# Patient Record
Sex: Female | Born: 1970 | Race: White | Hispanic: No | Marital: Married | Smoking: Never smoker
Health system: Southern US, Community
[De-identification: ages and names within clinical notes are randomized; demographics above are authoritative.]

---

## 2003-04-17 ENCOUNTER — Ambulatory Visit (HOSPITAL_COMMUNITY): Admission: RE | Admit: 2003-04-17 | Discharge: 2003-04-17 | Payer: Self-pay | Admitting: Ophthalmology

## 2003-04-17 ENCOUNTER — Encounter: Payer: Self-pay | Admitting: Ophthalmology

## 2003-04-28 ENCOUNTER — Encounter: Admission: RE | Admit: 2003-04-28 | Discharge: 2003-04-28 | Payer: Self-pay | Admitting: Family Medicine

## 2003-04-28 ENCOUNTER — Encounter: Payer: Self-pay | Admitting: Family Medicine

## 2004-07-06 ENCOUNTER — Inpatient Hospital Stay (HOSPITAL_COMMUNITY): Admission: AD | Admit: 2004-07-06 | Discharge: 2004-07-06 | Payer: Self-pay | Admitting: Obstetrics and Gynecology

## 2004-08-12 ENCOUNTER — Inpatient Hospital Stay (HOSPITAL_COMMUNITY): Admission: RE | Admit: 2004-08-12 | Discharge: 2004-08-15 | Payer: Self-pay | Admitting: Obstetrics and Gynecology

## 2004-08-12 ENCOUNTER — Encounter (INDEPENDENT_AMBULATORY_CARE_PROVIDER_SITE_OTHER): Payer: Self-pay | Admitting: Specialist

## 2004-11-23 ENCOUNTER — Other Ambulatory Visit: Admission: RE | Admit: 2004-11-23 | Discharge: 2004-11-23 | Payer: Self-pay | Admitting: Obstetrics and Gynecology

## 2005-07-15 ENCOUNTER — Emergency Department (HOSPITAL_COMMUNITY): Admission: EM | Admit: 2005-07-15 | Discharge: 2005-07-15 | Payer: Self-pay | Admitting: Emergency Medicine

## 2005-07-18 ENCOUNTER — Emergency Department (HOSPITAL_COMMUNITY): Admission: EM | Admit: 2005-07-18 | Discharge: 2005-07-19 | Payer: Self-pay | Admitting: Emergency Medicine

## 2006-08-24 ENCOUNTER — Other Ambulatory Visit: Admission: RE | Admit: 2006-08-24 | Discharge: 2006-08-24 | Payer: Self-pay | Admitting: Family Medicine

## 2007-09-17 ENCOUNTER — Other Ambulatory Visit: Admission: RE | Admit: 2007-09-17 | Discharge: 2007-09-17 | Payer: Self-pay | Admitting: Family Medicine

## 2008-12-16 ENCOUNTER — Other Ambulatory Visit: Admission: RE | Admit: 2008-12-16 | Discharge: 2008-12-16 | Payer: Self-pay | Admitting: Family Medicine

## 2010-07-23 ENCOUNTER — Encounter: Admission: RE | Admit: 2010-07-23 | Discharge: 2010-07-23 | Payer: Self-pay | Admitting: Family Medicine

## 2011-04-22 NOTE — Op Note (Signed)
NAMECLAUDE, SWENDSEN               ACCOUNT NO.:  [id number]   MEDICAL RECORD NO.:  [id number]                   PATIENT TYPE:  INP   LOCATION:  9136                                 FACILITY:  [facility]   PHYSICIAN:  Charles A. Sydnee Cabal, MD            DATE OF BIRTH:  [birth year]   DATE OF PROCEDURE:  08/12/2004  DATE OF DISCHARGE:                                 OPERATIVE REPORT   PREOPERATIVE DIAGNOSES:  1.  Intrauterine pregnancy at 38 weeks.  2.  Previous cesarean section.  3.  Borderline oligohydramnios.   POSTOPERATIVE DIAGNOSES:  1.  Intrauterine pregnancy at 38 weeks.  2.  Previous cesarean section.  3.  Borderline oligohydramnios.   PROCEDURE:  1.  Repeat low transverse cesarean section.  2.  Cord registry blood collection.   SURGEON:  Charles A. Sydnee Cabal, MD   ASSISTANT:  Rudy Jew. Ashley Royalty, M.D.   COMPLICATIONS:  None.   ESTIMATED BLOOD LOSS:  600 mL.   FINDINGS:  Very thin lower uterine segment adherent to the bladder, vigorous  female, Apgar's 7 & 9, normal uterus, ovaries, tubes and ovaries.   SPECIMENS:  Placenta to pathology.   COUNTS:  Sponge, needle and instrument counts were correct x2.   ANESTHESIA:  Spinal.   DESCRIPTION OF PROCEDURE:  The patient was taken to the operating room and  placed in supine position. After spinal was placed, anesthesia was adequate.  A sterile prep and drape was placed. A Pfannenstiel incision was made with  the knife, carried down to fascia, fascia was incised with a knife and Mayo  scissors.  The rectus sheath was released superiorly and inferiorly sharply.  The rectus muscles were sharply dissected in the midline. The peritoneum was  entered with Metzenbaum. There was no damage to bowel, bladder or vascular  structures. The peritoneum was opened and __________ placed, lower uterine  segment vesicouterine peritoneum was incised. Blunt dissection and sharp  dissection was used to carefully take the bladder off the  lower uterine  segment. In taking the adherent portion of the bladder off the lower uterine  scar, the uterus was opened secondary to its very thin nature.  Further  careful dissection took the bladder off this area of the scar without damage  to the bladder. This allowed further dissection of the bladder flap  inferiorly. The bladder blade was replaced protecting the bladder. The  uterus was further opened with traction, clear amniotic fluid was noted,  __________ was lifted. Frontal pressure was applied by the operator's  assistant, the baby was delivered without difficulty. The cord was clamped,  infant was taken to the neonatologist in attendance. Cord registry blood was  taken for protocol with sterile syringes. The placenta was manually  extracted and sent to pathology.  The internal surface of the uterus was  wiped with moistened lap, uterus was externalized for repair. Single layer  closure with #1 chromic running locking suture was carried out.  Three  figure-of-eight sutures were used  to achieve hemostasis.  This precluded  second layer closure in that imbrication of about 1/2 the incision was  closed with the figure-of-eight sutures in an imbricating type of closure.  This just about closed the entire incision imbricating over the first  effectively imbricating a second layer.  Hemostasis was excellent. Bladder  flap hemostasis was excellent. The uterus was reinternalized, pericolic  gutters were irrigated and subfascial hemostasis was verified.  The fascia  was then closed with #1 Vicryl running nonlocked sutures, subcutaneous  hemostasis was verified after irrigation. Sterile skin clips were applied,  sterile dressing was applied. The patient was taken to recovery with  physician in attendance having tolerated the procedure well.                                               Charles A. Sydnee Cabal, MD    CAD/MEDQ  D:  08/12/2004  T:  08/12/2004  Job:  [id number]

## 2011-04-22 NOTE — Discharge Summary (Signed)
Joan Ross, [place]ITZ   ACCOUNT NO.:  [id number]   MEDICAL RECORD NO.:  [id number]          PATIENT TYPE:  INP   LOCATION:  9136                          [facility]ACILITY:  [facility]   PHYSICIAN:  Charles A. Delcambre, MDDATE OF BIRTH:  1971/07/16   DATE OF ADMISSION:  08/12/2004  DATE OF DISCHARGE:  08/15/2004                                 DISCHARGE SUMMARY   PRIMARY DISCHARGE DIAGNOSES:  1.  Intrauterine pregnancy at 38 weeks.  2.  Previous cesarean section.  3.  Borderline oligohydramnios.   PROCEDURES:  1.  Repeat low transverse cesarean section.  2.  Cord registry blood collection.   DISPOSITION:  The patient discharged home to follow up in the office in 24-  48 hours to discontinue staples.  She was given instructions on discharge by  Dr. Lily Peer.  A prescription for Percocet 5/325 q.4-6h. p.r.n., Chromagen  Forte one p.o. daily.  Convalesce at home.  Notify of temperature greater  than 100 degrees, increased pain or bleeding, or incisional drainage.  No  driving for 2 weeks, no heavy lifting greater than 25 pounds for 4 weeks.  Shower okay, no bath or incisional submerging for 2 weeks.   HOSPITAL FINDINGS:  Postoperative laboratory:  Hematocrit 27.4, hemoglobin  9.3.  Baby a vigorous female, Apgars 7 and 9, 3285 g.   HISTORY AND PHYSICAL:  As dictated on the chart.   HOSPITAL COURSE:  The patient was admitted and underwent surgery as noted  above.  Postoperatively, the patient had good pain control with Duramorph.  She had Foley catheter discontinued postoperative day #1 and had no  difficulty voiding.  She had flatus return postoperative day #1.  She had  general diet given and tolerated this well.  Pain was controlled with p.o.  medications.  She had a bowel movement on postoperative day #2, continued to  do well on a general diet.  Pain was controlled.  She ambulated without  difficulty.  Postoperative day #3 she remained without complication and was  discharged  home with follow-up as noted above.      CAD/MEDQ  D:  08/30/2004  T:  08/31/2004  Job:  [id number]

## 2011-04-22 NOTE — H&P (Signed)
NAME:  Joan Ross, Joan Ross               ACCOUNT NO.:  [id number]   MEDICAL RECORD NO.:  [id number]                   PATIENT TYPE:   LOCATION:                                       FACILITY:   PHYSICIAN:  Charles A. Sydnee Cabal, MD            DATE OF BIRTH:  May 04, 1971   DATE OF ADMISSION:  DATE OF DISCHARGE:                                HISTORY & PHYSICAL   HISTORY OF PRESENT ILLNESS:  A 40 year old para 1-0-0-1 with previous  cesarean section and known scar will be at 38 weeks 0 days, [facility] August 13, 2004.  On the date of admission she is to be admitted to undergo a repeat  cesarean section.  The surgery had been scheduled for 39-40 weeks but will  be moved up at this time secondary to an AFI done two days prior to  admission, showing AFI only 7 cm.  With borderline oligohydramnios noted, we  have moved the surgery up.  The patient gives an informed consent.  She  understands the risks of the possibility of respiratory distress with  prematurity type of syndrome but the indications being borderline  oligohydramnios with resultant risks of prolonging pregnancy at this point  of danger to the baby in this regard.  She gives an informed consent for a  repeat cesarean section.  She accepts the risks of infection, bleeding,  bowel and bladder damage, uterine damage, blood products risk including  hepatitis and HIV exposure.  All questions were answered and we will proceed  as outlined.  She gives an informed consent.   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   SURGICAL HISTORY:  C-section.  She desires a repeat.   MEDICATIONS:  Prenatal vitamins.   ALLERGIES:  SULFA.   SOCIAL HISTORY:  She denies tobacco, ethanol, or drug use or __________  exposure in the past.  She is married and is in a monogamous relationship  with her husband, who is a PhD professor in Building surveyor at [employer].   FAMILY HISTORY:  She has a sister with schizophrenia.  Maternal  grandmother  and maternal great-grandmother with abdominal aortic aneurysms.  Mother with  some type of cancer felt secondary to a contraceptive implant? or ovary  cancer.  Mother is alive and well and living without difficulty at this time  after a hysterectomy and oophorectomy.   REVIEW OF SYSTEMS:  No nausea and vomiting currently.  She denies fever,  chills, rashes, lesions, headache, dizziness, chest pain, shortness of  breath, wheezing, diarrhea, constipation, bleeding, melena, hematochezia,  urgency, frequency, dysuria, incontinence, hematuria, galactorrhea, or  emotional changes.   PHYSICAL EXAMINATION:  VITAL SIGNS:  Blood pressure 120/78, weight 179  pounds, height 5 feet 2 inches, respirations 18, pulse 90.  HEENT:  Grossly within normal limits.  NECK:  Supple without thyromegaly or adenopathy.  LUNGS:  Clear bilaterally.  BACK:  No CVAT. Vertebral column nontender to palpation.  BREASTS:  No masses, tenderness, or discharge.  SKIN:  Nipple changes bilaterally.  HEART:  Regular rate and rhythm.  2/6 systolic ejection murmur left sternal  border.  ABDOMEN:  Gravid fundal height 37-38 cm.  Fetal heart rate on ultrasound  today 138.  AFI as noted 7 cm.  PELVIC:  Normal external female genitalia.  The remainder of the examination  deferred.  EXTREMITIES:  Mild to moderate edema bilaterally.   ASSESSMENT:  1.  Intrauterine pregnancy to be 38 weeks 0 days on the date of admission.  2.  Borderline oligohydramnios.  3.  Previous cesarean section desiring repeat cesarean section.   PLAN:  N.p.o. past midnight.  Preoperative CBC and type and screen.  We will  plan a repeat cesarean section.  Counseling as noted above.  An informed  consent is given.  We will proceed as outlined.  Blood type O positive,  antibody screen negative for all prenatal labs. VDRL nonreactive, Rubella  immune.  Hepatitis B surface antigen negative.  HIV negative.  Cystic  fibrosis negative.  Hepatitis C  negative.  TSH normal.  GC and Chlamydia  declined.  Pap negative.  Triple and quad screen normal.  One hour Glucola  105.  Hemoglobin 11.5.  Group B strep negative.                                               Charles A. Sydnee Cabal, MD    CAD/MEDQ  D:  08/10/2004  T:  08/10/2004  Job:  [id number]

## 2011-12-13 ENCOUNTER — Other Ambulatory Visit: Payer: Self-pay | Admitting: Obstetrics and Gynecology

## 2011-12-13 ENCOUNTER — Other Ambulatory Visit (HOSPITAL_COMMUNITY)
Admission: RE | Admit: 2011-12-13 | Discharge: 2011-12-13 | Disposition: A | Discharge status: Home / Self Care | Payer: BC Managed Care – PPO | Source: Ambulatory Visit | Attending: Obstetrics and Gynecology | Admitting: Obstetrics and Gynecology

## 2011-12-13 DIAGNOSIS — Z01419 Encounter for gynecological examination (general) (routine) without abnormal findings: Secondary | ICD-10-CM | POA: Insufficient documentation

## 2012-07-02 ENCOUNTER — Other Ambulatory Visit: Payer: Self-pay | Admitting: Family Medicine

## 2012-07-02 DIAGNOSIS — Z1231 Encounter for screening mammogram for malignant neoplasm of breast: Secondary | ICD-10-CM

## 2012-07-11 ENCOUNTER — Ambulatory Visit
Admission: RE | Admit: 2012-07-11 | Discharge: 2012-07-11 | Disposition: A | Discharge status: Home / Self Care | Payer: BC Managed Care – PPO | Source: Ambulatory Visit | Attending: Family Medicine | Admitting: Family Medicine

## 2012-07-11 DIAGNOSIS — Z1231 Encounter for screening mammogram for malignant neoplasm of breast: Secondary | ICD-10-CM

## 2013-07-30 ENCOUNTER — Other Ambulatory Visit: Payer: Self-pay | Admitting: Family Medicine

## 2013-07-30 ENCOUNTER — Other Ambulatory Visit: Payer: Self-pay

## 2013-07-31 ENCOUNTER — Other Ambulatory Visit: Payer: Self-pay | Admitting: Family Medicine

## 2013-07-31 DIAGNOSIS — N649 Disorder of breast, unspecified: Secondary | ICD-10-CM

## 2013-08-15 ENCOUNTER — Other Ambulatory Visit: Payer: Self-pay | Admitting: Family Medicine

## 2013-08-15 ENCOUNTER — Ambulatory Visit
Admission: RE | Admit: 2013-08-15 | Discharge: 2013-08-15 | Disposition: A | Discharge status: Home / Self Care | Payer: BC Managed Care – PPO | Source: Ambulatory Visit | Attending: Family Medicine | Admitting: Family Medicine

## 2013-08-15 DIAGNOSIS — N649 Disorder of breast, unspecified: Secondary | ICD-10-CM

## 2014-05-23 ENCOUNTER — Other Ambulatory Visit: Payer: Self-pay | Admitting: Family Medicine

## 2014-05-23 ENCOUNTER — Ambulatory Visit
Admission: RE | Admit: 2014-05-23 | Discharge: 2014-05-23 | Disposition: A | Discharge status: Home / Self Care | Payer: BC Managed Care – PPO | Source: Ambulatory Visit | Attending: Family Medicine | Admitting: Family Medicine

## 2014-05-23 DIAGNOSIS — M79671 Pain in right foot: Secondary | ICD-10-CM

## 2015-03-19 ENCOUNTER — Other Ambulatory Visit: Payer: Self-pay | Admitting: Family Medicine

## 2015-03-19 ENCOUNTER — Other Ambulatory Visit (HOSPITAL_COMMUNITY)
Admission: RE | Admit: 2015-03-19 | Discharge: 2015-03-19 | Disposition: A | Discharge status: Home / Self Care | Payer: BC Managed Care – PPO | Source: Ambulatory Visit | Attending: Family Medicine | Admitting: Family Medicine

## 2015-03-19 DIAGNOSIS — Z01419 Encounter for gynecological examination (general) (routine) without abnormal findings: Secondary | ICD-10-CM | POA: Diagnosis present

## 2015-03-20 LAB — CYTOLOGY - PAP

## 2015-05-13 ENCOUNTER — Encounter: Payer: Self-pay | Admitting: Sports Medicine

## 2015-05-13 ENCOUNTER — Ambulatory Visit (INDEPENDENT_AMBULATORY_CARE_PROVIDER_SITE_OTHER): Payer: BC Managed Care – PPO | Admitting: Sports Medicine

## 2015-05-13 VITALS — BP 114/76 | HR 66 | Ht 62.0 in | Wt 143.0 lb

## 2015-05-13 DIAGNOSIS — M76899 Other specified enthesopathies of unspecified lower limb, excluding foot: Secondary | ICD-10-CM | POA: Insufficient documentation

## 2015-05-13 DIAGNOSIS — M76891 Other specified enthesopathies of right lower limb, excluding foot: Secondary | ICD-10-CM | POA: Diagnosis not present

## 2015-05-13 MED ORDER — NITROGLYCERIN 0.2 MG/HR TD PT24: MEDICATED_PATCH | TRANSDERMAL | Status: DC

## 2015-05-13 NOTE — Progress Notes (Signed)
  Joan ShanksMatina Ross - 44 y.o. female MRN [id number]  Date of birth: [birth year]  SUBJECTIVE: Including CC, HPI, ROS Chief Complaint  Patient presents with  . New Evaluation    Right anterior hip pain   HPI Comments: Insidious onset of right anterior hip pain following running. Reports nonradiating pain over the right anterior lateral hip. Pain is most focally over the belly of the tensor fascia lata. Pain over the greater trochanter as well. Has tried taking occasional ibuprofen and using ice without significant improvement. Having a difficult time with running and has had to stop after 2-3 miles due to his worsening pain. No significant nighttime awakenings. Denies any numbness, tingling recent fevers, chills weight loss. She does report briefly losing her balance and having a essentially small right ankle inversion injury during the run prior to the onset of this pain.   Review of Systems  All other systems reviewed and are negative.   OBJECTIVE: BP 114/76 mmHg  Pulse 66  Ht 5\' 2"  (1.575 m)  Wt 143 lb (64.864 kg)  BMI 26.15 kg/m2  Physical Exam  PHYSICAL EXAM: GENERAL: Adult Caucasian female. No acute distress PSYCH: Alert and appropriately interactive. SKIN: No open skin lesions or abnormal skin markings on areas inspected as below VASCULAR: DP & PT pulses 2+/4. No pre-tibial edema NEURO: Lower extremity strength is 5+/5 in all myotomes; sensation is intact to light touch in all dermatomes. RIGHT HIP: Overall well aligned no significant deformity. She has tenderness palpation over the tensor fascia lata as well as the greater trochanter. Strength is 5/5 in hip abduction, hip flexion and extension. Knee range of motion is normal. Internal/external rotation of the hip is normal. Negative FABER and FADIR. Negative logroll   DATA REVIEWED & OBTAINED:  LIMITED MSK ULTRASOUND OF RIGHT hip: Findings:   Disorganized tissue texture at the midportion of the lateral tensor fascia lata  without significant neovascularity. There is a small bursal layer over the greater trochanter. 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 No specialty comments available. Social History   Occupational History  . Biology Professor [employer]   Social History Main Topics  . Smoking status: Never Smoker   . Smokeless tobacco: Not on file  . Alcohol Use: Not on file  . Drug Use: Not on file  . Sexual Activity: Not on file     Problem  Hip Abductor Tendinitis      ASSESSMENT & PLAN: See Patient instructions for additional information   ICD-9-CM ICD-10-CM   1. Hip abductor tendinitis, right 726.5 M76.891 nitroGLYCERIN (NITRODUR - DOSED IN MG/24 HR) 0.2 mg/hr patch   Ultrasound evidence of partial tearing tensor fascia lata with reactive bursa over the greater trochanter     Hip abductor tendinitis Tensor fascia lata eccentric exercises Activity modification, limiting to pain in more than 2-3 out of 10. Nitroglycerin protocol    FOLLOW UP:  Return in about 10 weeks (around 07/22/2015) for repeat ultrasound.

## 2015-05-13 NOTE — Assessment & Plan Note (Signed)
Tensor fascia lata eccentric exercises Activity modification, limiting to pain in more than 2-3 out of 10. Nitroglycerin protocol

## 2015-07-22 ENCOUNTER — Ambulatory Visit: Payer: BC Managed Care – PPO | Admitting: Sports Medicine

## 2015-07-29 ENCOUNTER — Ambulatory Visit (INDEPENDENT_AMBULATORY_CARE_PROVIDER_SITE_OTHER): Payer: BC Managed Care – PPO | Admitting: Sports Medicine

## 2015-07-29 ENCOUNTER — Encounter: Payer: Self-pay | Admitting: Sports Medicine

## 2015-07-29 VITALS — BP 110/77 | Ht 62.0 in | Wt 143.0 lb

## 2015-07-29 DIAGNOSIS — M76891 Other specified enthesopathies of right lower limb, excluding foot: Secondary | ICD-10-CM

## 2015-07-29 NOTE — Progress Notes (Signed)
   Subjective:    Patient ID: Joan Ross, female    DOB: 09/08/1971, 44 y.o.   MRN: [id number]  HPI   Patient comes in today for follow-up on a right tensor fascia lata muscle tear. She is about 3 months out from her last office visit. Overall she is improving. She has returned to running but still experiencing some discomfort along the lateral hip. She gets some noticeable swelling as well. Initially her pain was severe enough that it was awakening her at night but that has improved. She denies groin pain. Previous ultrasound done by Dr. Berline Chough confirmed a partial tensor fascia lata tear. She was given a nitroglycerin protocol and instructed in eccentric exercises. She did not tolerate the nitroglycerin due to a headache. She has been compliant with her exercises.    Review of Systems     Objective:   Physical Exam Well-developed, well-nourished. No acute distress. Awake alert and oriented 3. Vital signs reviewed  Right hip: Smooth painless hip range of motion with a negative log roll. She still has tenderness to palpation over the tensor fascia lata. No palpable defect. No significant soft tissue swelling. Good hip abductor strength. Neurovascularly intact distally. Walking without a significant limp.  MSK ultrasound of the lateral right hip was performed and compared to previous scans. Overall the hypoechoic area seen in the tensor fascia lata on her previous scan has decreased. However there does appear to be some separation of the fibers with active abduction. Findings are consistent with a healing tensor fascia lata tear       Assessment & Plan:  Improving lateral right hip pain secondary to tensor fascia lata tear  Patient will continue with her current treatment including eccentric exercises and increasing activity as tolerated. We discussed the 10% rule and how it relates to running. Follow-up in 6 weeks for repeat ultrasound. Call with questions or concerns in the  interim.

## 2015-09-09 ENCOUNTER — Ambulatory Visit: Payer: BC Managed Care – PPO | Admitting: Sports Medicine

## 2016-01-29 IMAGING — CR DG FOOT COMPLETE 3+V*R*
3 series · 3 of 3 positions shown · non-contrast
Comparison: None.

CLINICAL DATA: Trauma. Stepped on something. Wound on the bottom of
the foot.

EXAM:
RIGHT FOOT COMPLETE - 3+ VIEW

[t foot ap right]
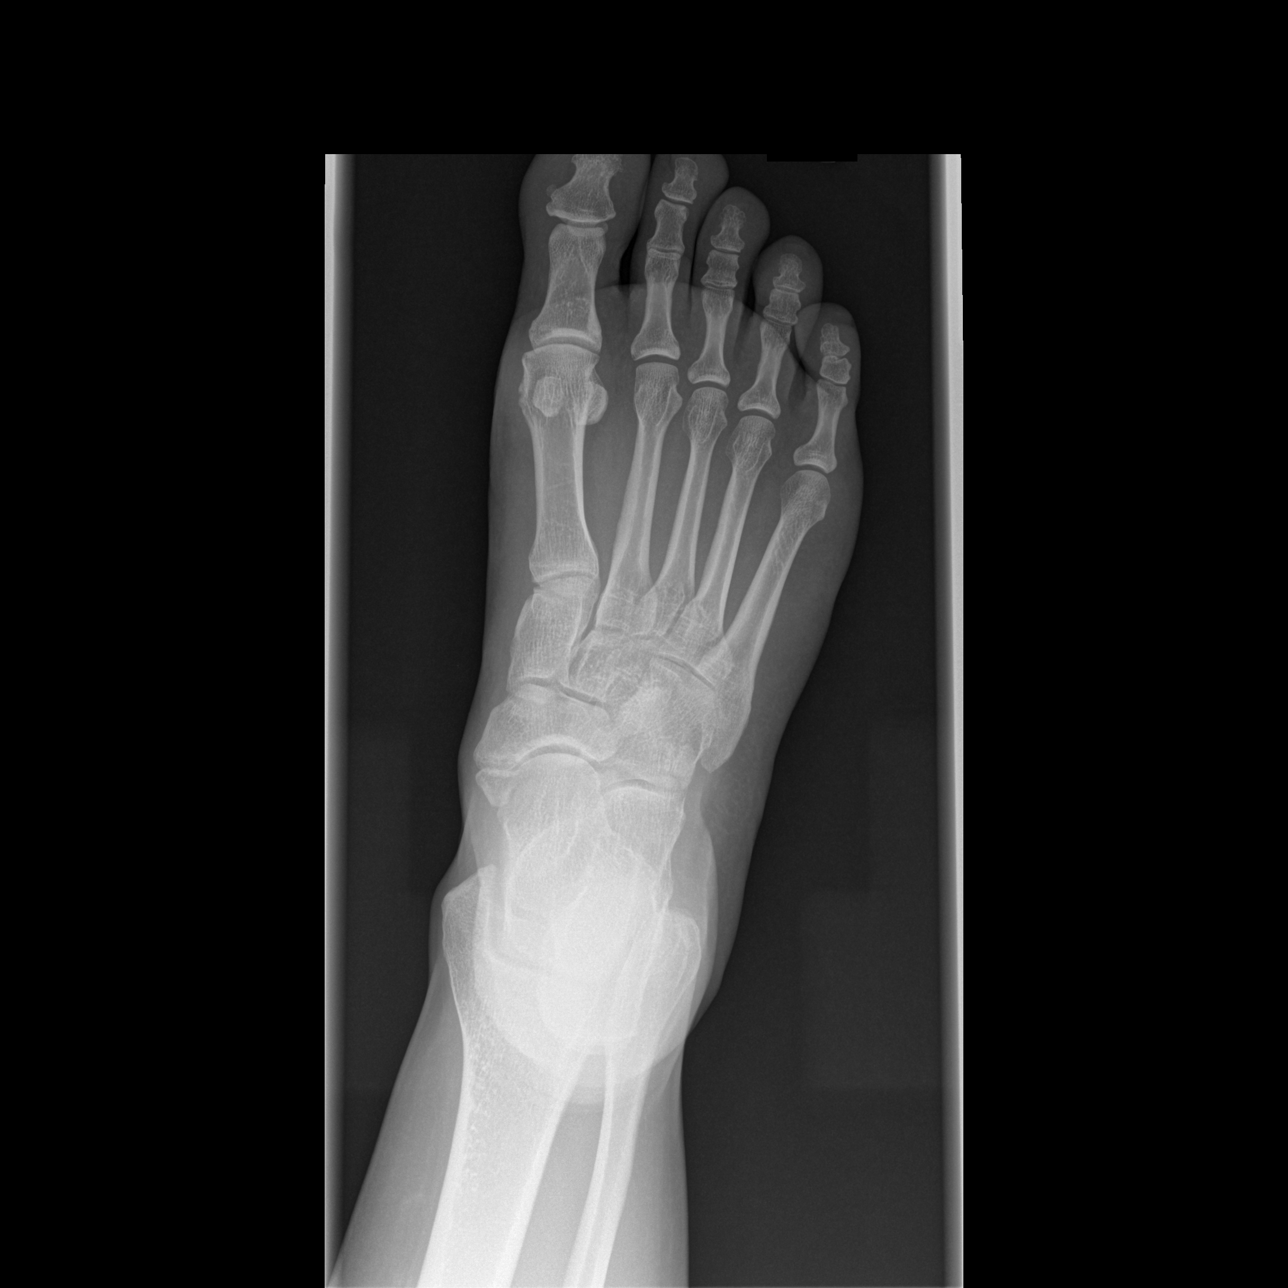

[t foot oblique right]
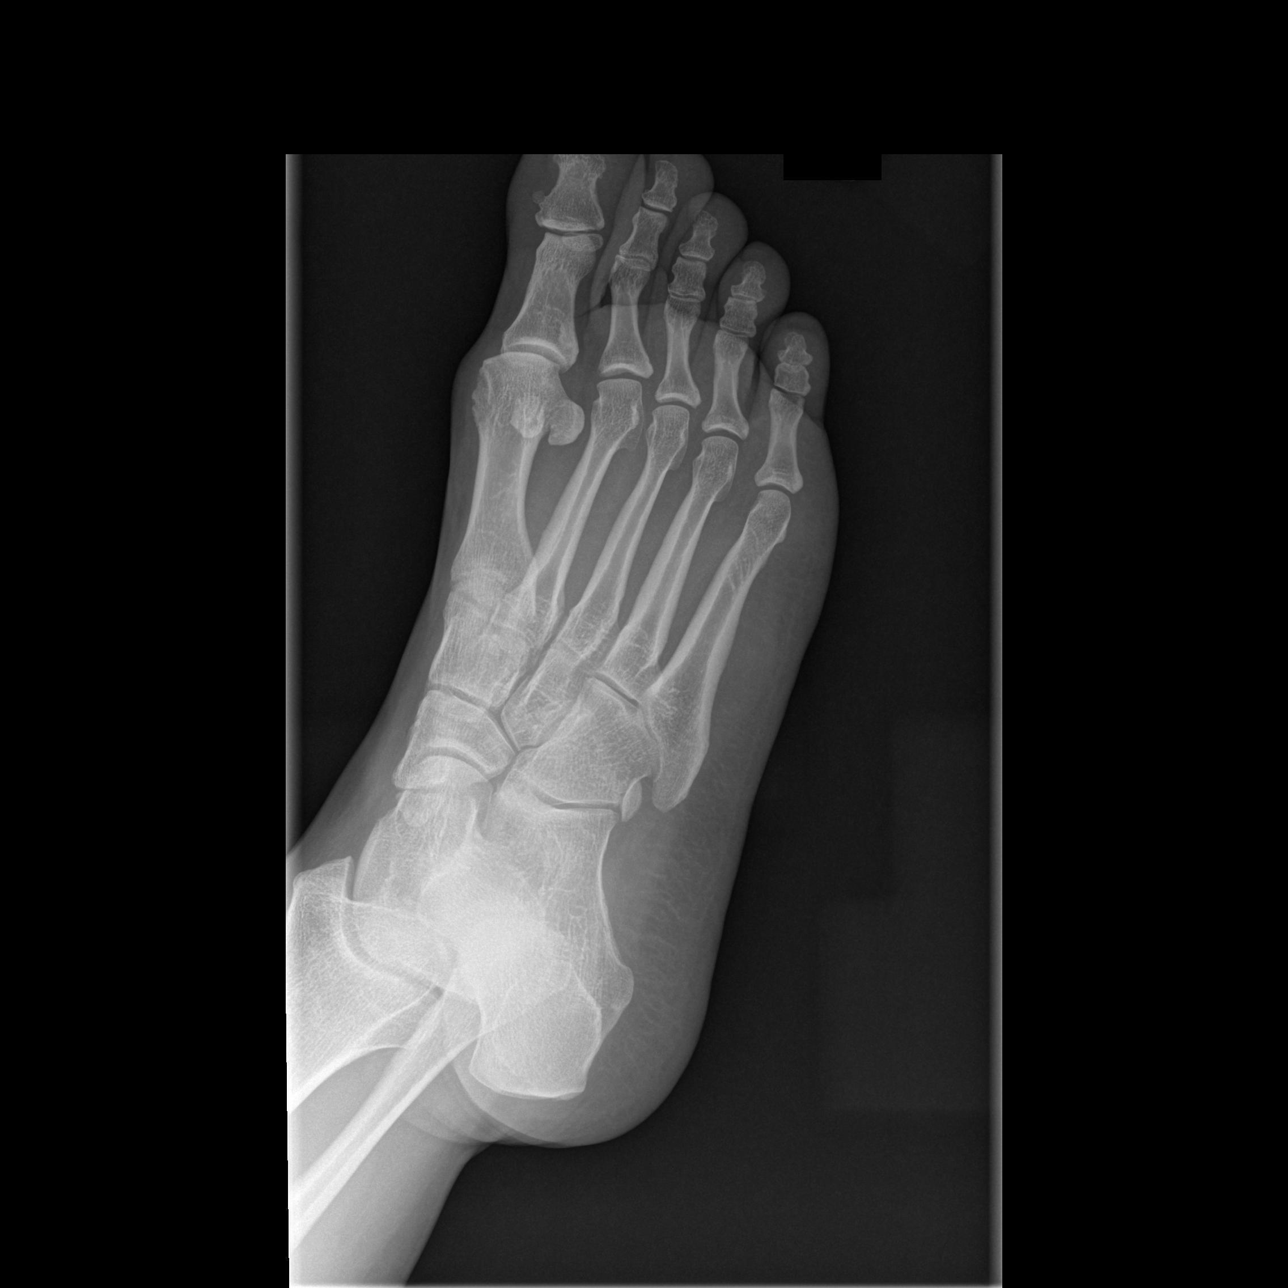

[t foot lat right]
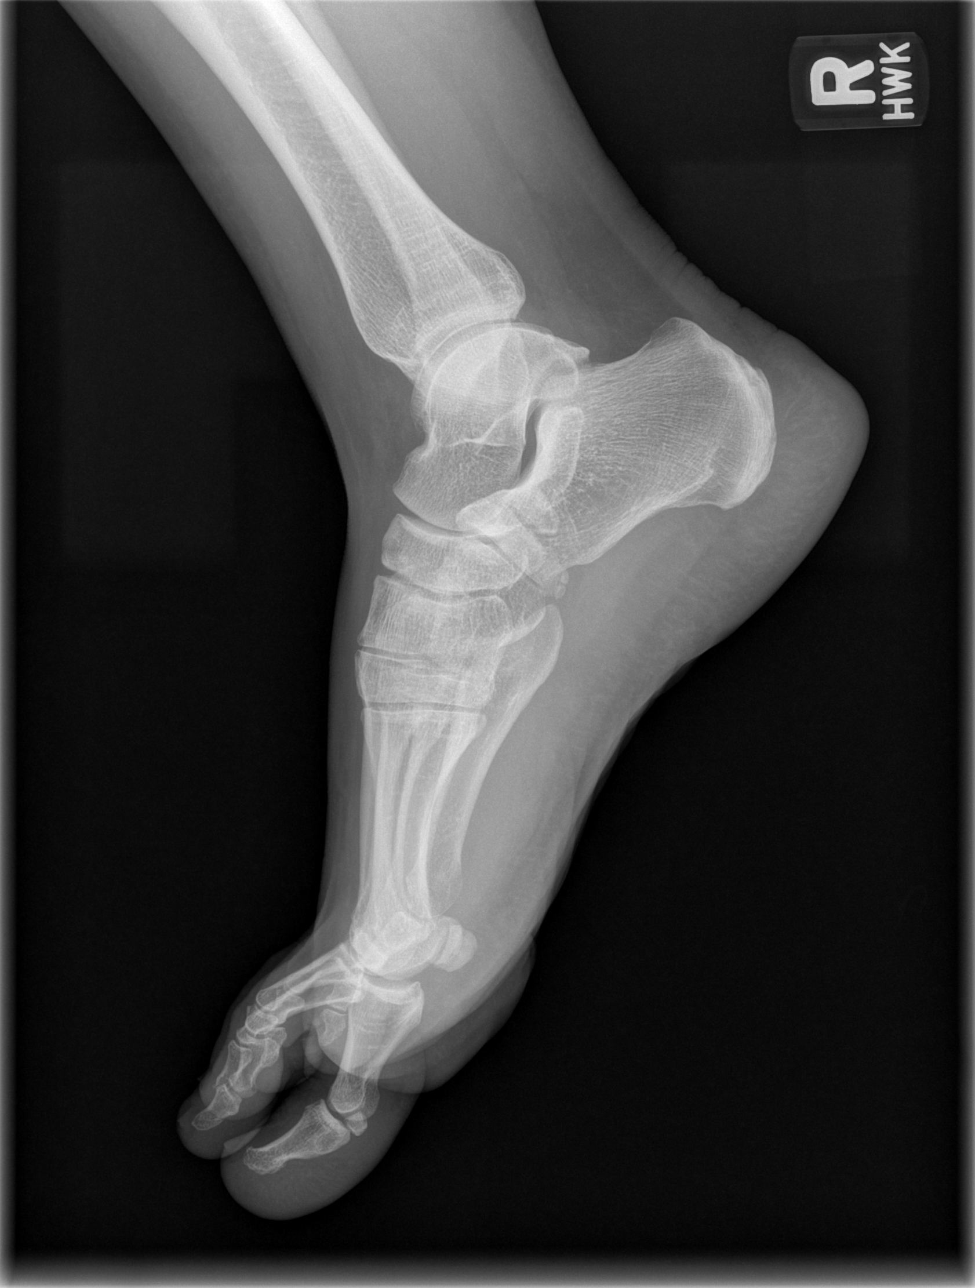

[3 of 3 positions shown; findings below may reference images not displayed]

FINDINGS: No fracture. No dislocation. Mild osteoarthritic changes at the
first metatarsophalangeal joint.

There is no radiopaque foreign body.
IMPRESSION: No fracture or dislocation.  No radiopaque foreign body.

## 2017-06-23 ENCOUNTER — Ambulatory Visit (INDEPENDENT_AMBULATORY_CARE_PROVIDER_SITE_OTHER): Payer: BC Managed Care – PPO | Admitting: Internal Medicine

## 2017-06-23 ENCOUNTER — Ambulatory Visit (INDEPENDENT_AMBULATORY_CARE_PROVIDER_SITE_OTHER)
Admission: RE | Admit: 2017-06-23 | Discharge: 2017-06-23 | Disposition: A | Discharge status: Home / Self Care | Payer: BC Managed Care – PPO | Source: Ambulatory Visit | Attending: Internal Medicine | Admitting: Internal Medicine

## 2017-06-23 ENCOUNTER — Encounter: Payer: Self-pay | Admitting: Internal Medicine

## 2017-06-23 VITALS — BP 110/60 | HR 62 | Ht 62.0 in | Wt 145.0 lb

## 2017-06-23 DIAGNOSIS — Z77090 Contact with and (suspected) exposure to asbestos: Secondary | ICD-10-CM

## 2017-06-23 NOTE — Progress Notes (Signed)
Subjective:     Patient ID: Joan ShanksMatina Ross, female   DOB: March 22, 1971,     MRN: [id number]  HPI  46 yo [place]/American/ [place] citizen minimal casual smoking hx referred to pulmonary clinic 06/23/2017 by Dr  Merri Brunetteandace Smith p reported asbestos exposure   2003 - Nov 2017    History of Present Illness  06/23/2017 1st [facility] office visit/ Amaani Guilbault   Chief Complaint  Patient presents with  . Pulmonary Consult    Referred by Dr. Merri Brunetteandace Smith. Pt states that she was exposed to asbestos- worked in a building with asbestos for 15 yrs.   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 No unusual exp hx or h/o childhood pna/ asthma or knowledge of premature birth.  Sleeping ok without nocturnal  or early am exacerbation  of respiratory  c/o's or need for noct saba. Also denies any obvious fluctuation of symptoms with weather or environmental changes or other aggravating or alleviating factors except as outlined above   Current Medications, Allergies, Complete Past Medical History, Past Surgical History, Family History, and Social History were reviewed in [facility] electronic medical record.  ROS  The following are not active complaints unless bolded sore throat, dysphagia, dental problems, itching, sneezing,  nasal congestion or excess/ purulent secretions, ear ache,   fever, chills, sweats, unintended wt loss, classically pleuritic or exertional cp, hemoptysis,  orthopnea pnd or leg swelling, presyncope, palpitations, abdominal pain, anorexia, nausea, vomiting, diarrhea  or change in bowel or bladder habits, change in stools or  urine, dysuria,hematuria,  rash, arthralgias, visual complaints, headache, numbness, weakness or ataxia or problems with walking or coordination,  change in mood/affect or memory.        Review of Systems     Objective:   Physical Exam  amb wf nad  Wt Readings from Last 3 Encounters:  06/23/17 145 lb (65.8 kg)  07/29/15 143 lb (64.9 kg)  05/13/15 143 lb (64.9 kg)    Vital signs reviewed  - Note on arrival 02 sats  100% on RA    HEENT: nl dentition, turbinates bilaterally, and oropharynx. Nl external ear canals without cough reflex   NECK :  without JVD/Nodes/TM/ nl carotid upstrokes bilaterally   LUNGS: no acc muscle use,  Nl contour chest which is clear to A and P bilaterally without cough on insp or exp maneuvers   CV:  RRR  no s3 or murmur or increase in P2, and no edema   ABD:  soft and nontender with nl inspiratory excursion in the supine position. No bruits or organomegaly appreciated, bowel sounds nl  MS:  Nl gait/ ext warm without deformities, calf tenderness, cyanosis or clubbing No obvious joint restrictions   SKIN: warm and dry without lesions    NEURO:  alert, approp, nl sensorium with  no motor or cerebellar deficits apparent.    CXR PA and Lateral:   06/23/2017 :    I personally reviewed images and agree with radiology impression as follows:   Lungs clear. No pleural plaques or diaphragmatic calcification. Cardiac silhouette within normal limits. No evident adenopathy.  Assessment:

## 2017-06-23 NOTE — Patient Instructions (Signed)
Please remember to go to the  x-ray department downstairs in the basement  for your tests - we will call you with the results when they are available.    Please schedule a follow up visit in 12  months but call sooner if needed    Call if new cough or short of breath or pain with deep breathing

## 2017-06-23 NOTE — Assessment & Plan Note (Signed)
Reported exp 08/2016 - 11/2016 with nl cxr 06/23/2017   Reviewed the natural hx of asbestos lung dz including pleural plaques and ILD with risk of lung ca quite high in smokers  Which would not rise to threshold of justifying serial ct though perhaps she might consider Low dose screening if really worried about it (based on her co-workers) and paying out of pocket (as would not likely be reimbursed by insurance).   Discussed in detail all the  indications, usual  risks and alternatives  relative to the benefits with patient who agrees to proceed with conservative f/u as outlined    Total time devoted to counseling  > 50 % of initial 40 min office visit:  review case with pt/ discussion of options/alternatives/ personally creating written customized instructions  in presence of pt  then going over those specific  Instructions directly with the pt including how to use all of the meds but in particular covering each new medication in detail and the difference between the maintenance= "automatic" meds and the prns using an action plan format for the latter (If this problem/symptom => do that organization reading Left to right).  Please see AVS from this visit for a full list of these instructions which I personally wrote for this pt and  are unique to this visit.

## 2017-06-23 NOTE — Progress Notes (Signed)
LMOM informing her that her cxr was normal

## 2018-04-10 ENCOUNTER — Other Ambulatory Visit: Payer: Self-pay | Admitting: Family Medicine

## 2018-04-10 ENCOUNTER — Other Ambulatory Visit (HOSPITAL_COMMUNITY)
Admission: RE | Admit: 2018-04-10 | Discharge: 2018-04-10 | Disposition: A | Discharge status: Home / Self Care | Payer: BC Managed Care – PPO | Source: Ambulatory Visit | Attending: Family Medicine | Admitting: Family Medicine

## 2018-04-10 DIAGNOSIS — Z124 Encounter for screening for malignant neoplasm of cervix: Secondary | ICD-10-CM | POA: Diagnosis present

## 2018-04-12 LAB — CYTOLOGY - PAP
Diagnosis: NEGATIVE
HPV: NOT DETECTED

## 2018-04-18 ENCOUNTER — Other Ambulatory Visit: Payer: Self-pay | Admitting: Family Medicine

## 2018-04-18 DIAGNOSIS — Z1231 Encounter for screening mammogram for malignant neoplasm of breast: Secondary | ICD-10-CM

## 2018-05-14 ENCOUNTER — Ambulatory Visit
Admission: RE | Admit: 2018-05-14 | Discharge: 2018-05-14 | Disposition: A | Discharge status: Home / Self Care | Payer: BC Managed Care – PPO | Source: Ambulatory Visit | Attending: Family Medicine | Admitting: Family Medicine

## 2018-05-14 DIAGNOSIS — Z1231 Encounter for screening mammogram for malignant neoplasm of breast: Secondary | ICD-10-CM

## 2018-06-25 ENCOUNTER — Ambulatory Visit: Payer: BC Managed Care – PPO | Admitting: Internal Medicine

## 2019-03-01 IMAGING — DX DG CHEST 2V
2 series · 2 of 2 positions shown · non-contrast
Comparison: None.

CLINICAL DATA: History of asbestos exposure

EXAM:
CHEST  2 VIEW

[chest pa]
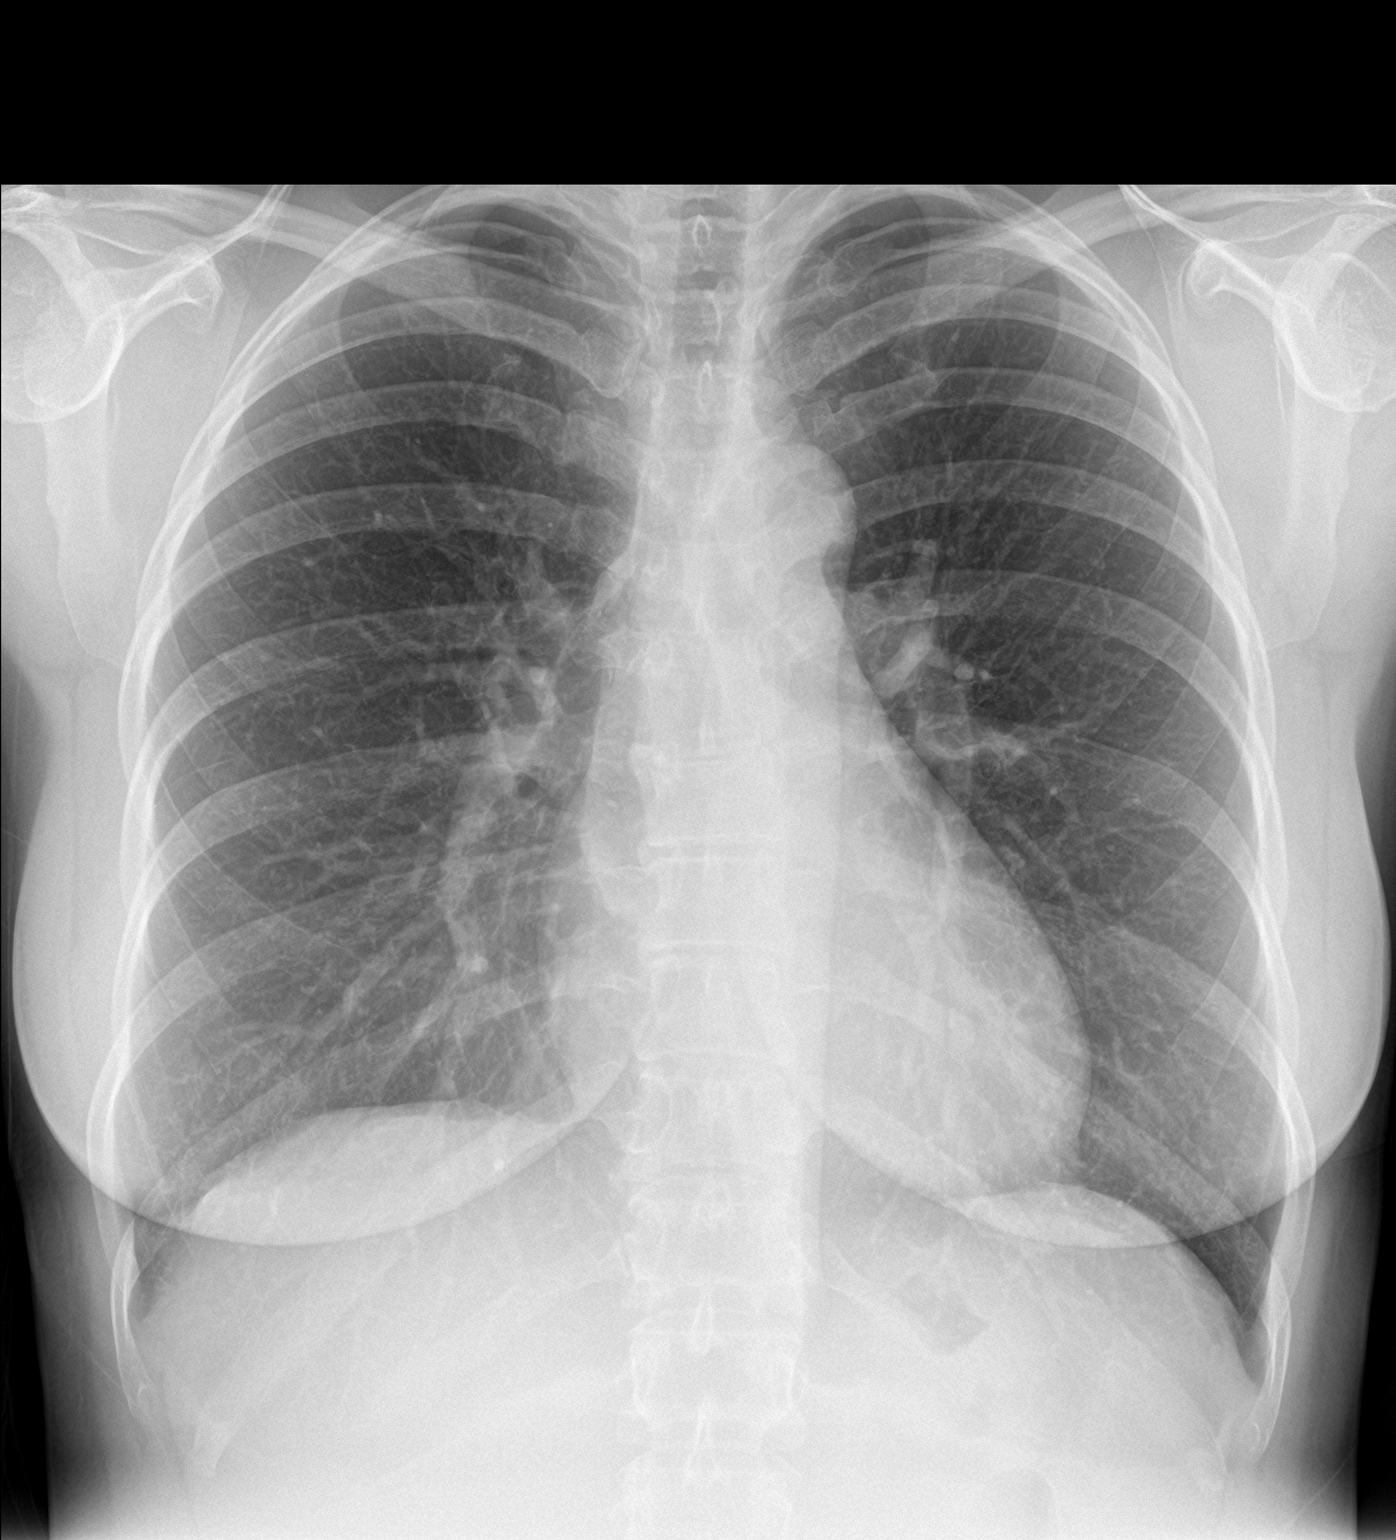

[chest lat]
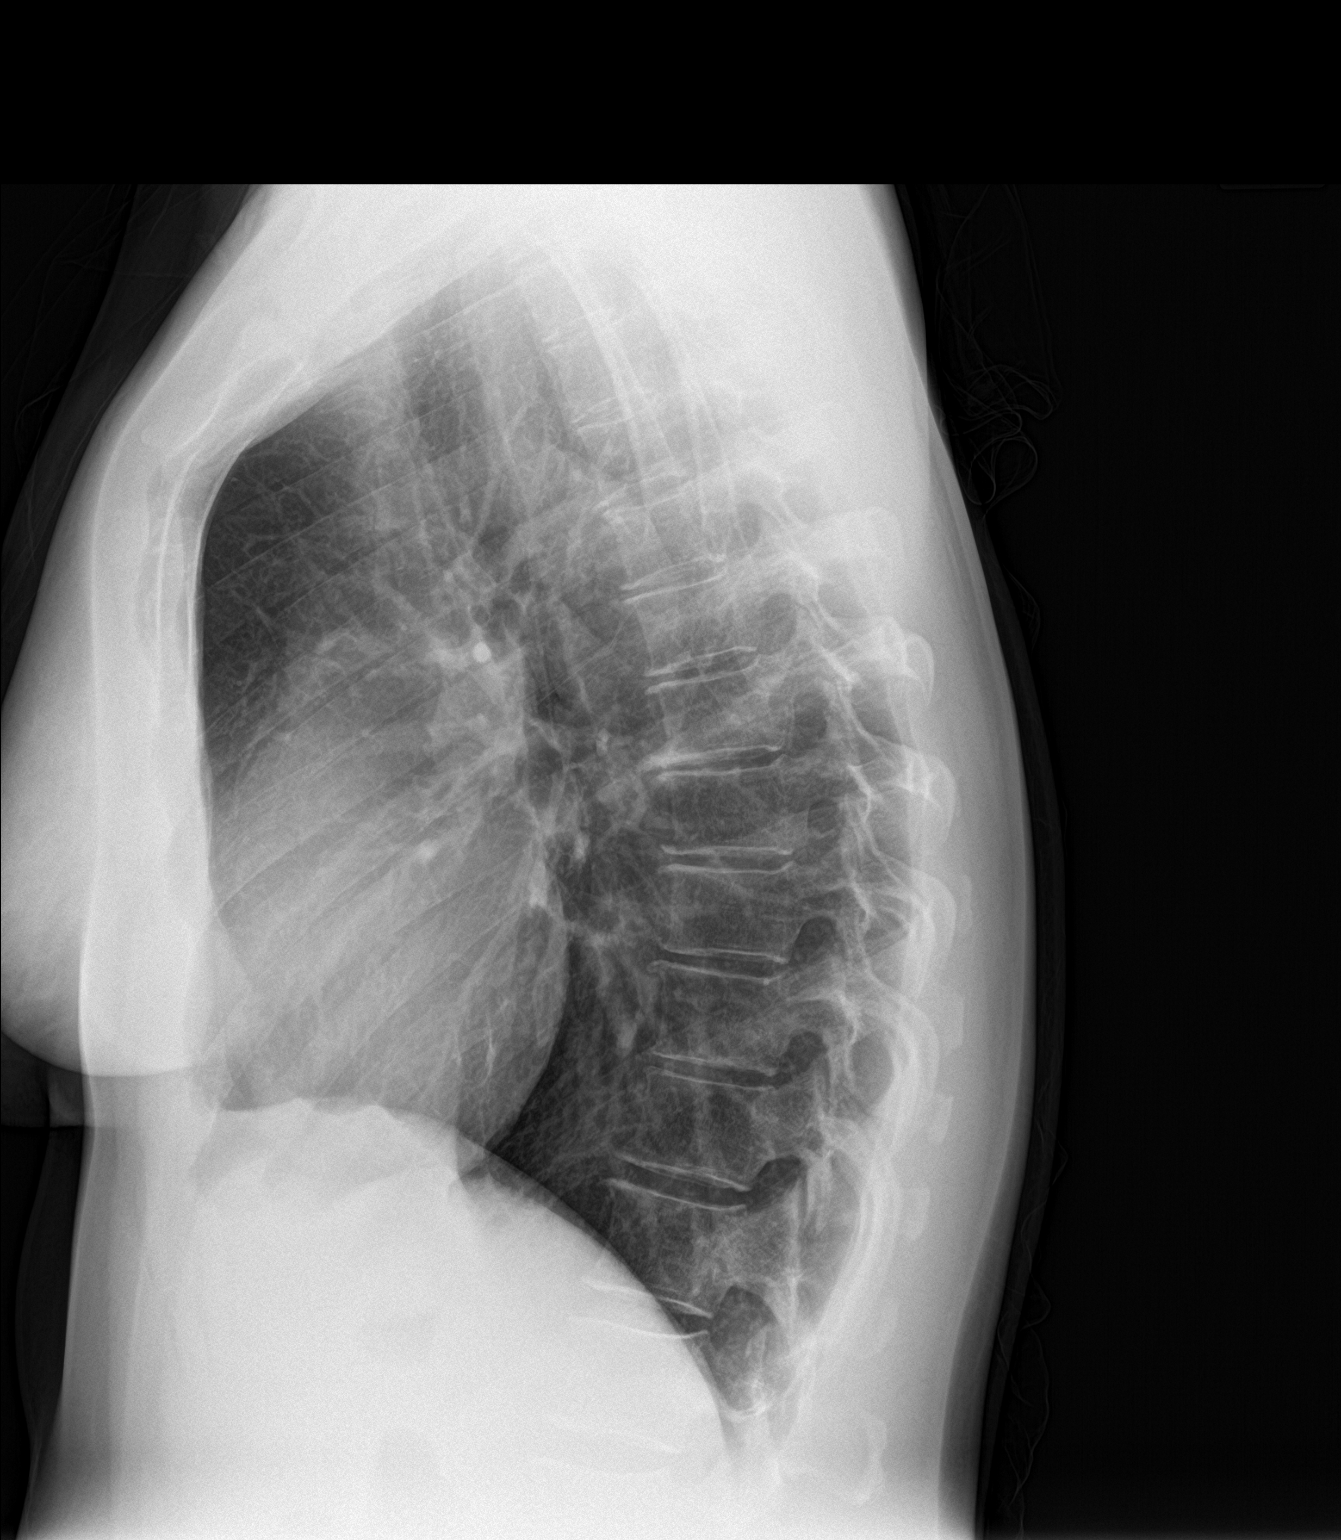

[2 of 2 positions shown; findings below may reference images not displayed]

FINDINGS: Lungs are clear. No demonstrable pleural or diaphragmatic plaques or
calcification. Heart size and pulmonary vascularity are normal. No
adenopathy. There are no evident bone lesions.
IMPRESSION: Lungs clear. No pleural plaques or diaphragmatic calcification.
Cardiac silhouette within normal limits. No evident adenopathy.
